# Patient Record
Sex: Female | Born: 1955 | Race: Black or African American | Hispanic: No | Marital: Single | Smoking: Never smoker
Health system: Southern US, Community
[De-identification: ages and names within clinical notes are randomized; demographics above are authoritative.]

## PROBLEM LIST (undated history)

## (undated) DIAGNOSIS — E079 Disorder of thyroid, unspecified: Secondary | ICD-10-CM

## (undated) HISTORY — PX: REPLACEMENT TOTAL KNEE: SUR1224

---

## 2016-08-18 ENCOUNTER — Emergency Department (HOSPITAL_COMMUNITY): Payer: Worker's Compensation

## 2016-08-18 ENCOUNTER — Encounter (HOSPITAL_COMMUNITY): Payer: Self-pay

## 2016-08-18 ENCOUNTER — Emergency Department (HOSPITAL_COMMUNITY)
Admission: EM | Admit: 2016-08-18 | Discharge: 2016-08-18 | Disposition: A | Discharge status: Home / Self Care | Payer: Worker's Compensation | Attending: Emergency Medicine | Admitting: Emergency Medicine

## 2016-08-18 DIAGNOSIS — S60222A Contusion of left hand, initial encounter: Secondary | ICD-10-CM | POA: Diagnosis not present

## 2016-08-18 DIAGNOSIS — S6992XA Unspecified injury of left wrist, hand and finger(s), initial encounter: Secondary | ICD-10-CM | POA: Diagnosis present

## 2016-08-18 DIAGNOSIS — Y939 Activity, unspecified: Secondary | ICD-10-CM | POA: Insufficient documentation

## 2016-08-18 DIAGNOSIS — Y929 Unspecified place or not applicable: Secondary | ICD-10-CM | POA: Insufficient documentation

## 2016-08-18 DIAGNOSIS — W228XXA Striking against or struck by other objects, initial encounter: Secondary | ICD-10-CM | POA: Diagnosis not present

## 2016-08-18 DIAGNOSIS — Y990 Civilian activity done for income or pay: Secondary | ICD-10-CM | POA: Diagnosis not present

## 2016-08-18 HISTORY — DX: Disorder of thyroid, unspecified: E07.9

## 2016-08-18 MED ORDER — DICLOFENAC SODIUM 50 MG PO TBEC: 50.0000 mg | DELAYED_RELEASE_TABLET | Freq: Two times a day (BID) | ORAL | 0 refills | Status: AC

## 2016-08-18 MED ORDER — HYDROCODONE-ACETAMINOPHEN 5-325 MG PO TABS
1.0000 | ORAL_TABLET | Freq: Once | ORAL | Status: AC
  Administered 2016-08-18: 1 via ORAL
  Filled 2016-08-18: qty 1

## 2016-08-18 NOTE — ED Notes (Signed)
Pt taken to xray 

## 2016-08-18 NOTE — Progress Notes (Signed)
Orthopedic Tech Progress Note Patient Details:  Monica Shelton June 08, 1955 [id number]  Ortho Devices Type of Ortho Device: Ace wrap, Volar splint Ortho Device/Splint Location: LUE Ortho Device/Splint Interventions: Ordered, Application   Jennye MoccasinHughes, Albertina Leise Craig 08/18/2016, 9:54 PM

## 2016-08-18 NOTE — ED Triage Notes (Signed)
Pt states she had left hand jammed by heavy boxes at work a couple hours prior to arrival; pt states pain at 6/10 and had not taken med prior to arrival; pt a&ox 4; xray ordered at triage

## 2016-08-18 NOTE — ED Provider Notes (Signed)
[facility]-EMERGENCY DEPT Provider Note   CSN: [id number] Arrival date & time: 08/18/16  2002  By signing my name [id number], I, Modena JanskyAlbert Thayil, attest that this documentation has been prepared under the direction and in the presence of non-physician practitioner, Kerrie BuffaloHope Luevenia Mcavoy, NP. Electronically Signed: Modena JanskyAlbert Thayil, Scribe. 08/18/2016. 9:27 PM.  History   Chief Complaint Chief Complaint  Patient presents with  . Hand Injury   The history is provided by the patient. No language interpreter was used.  Hand Injury   The incident occurred 3 to 5 hours ago. The incident occurred at work. The injury mechanism was compression. The pain is present in the left hand. The pain is moderate. The pain has been constant since the incident. Pertinent negatives include no fever. The symptoms are aggravated by movement and use. She has tried nothing for the symptoms.   HPI Comments: Monica Shelton is a 61 y.o. female who presents to the Emergency Department complaining of constant moderate left hand pain that started 4 hours ago. She states she hit the back of her hand on a heavy box while at work. Her hand was flexed during impact but not beyond her usual ROM. She iced area PTA but took no medication. Her pain is exacerbated by left hand use. He reports associated tingling. Denies any other complaints at this time.  Past Medical History:  Diagnosis Date  . Thyroid disease     There are no active problems to display for this patient.   Past Surgical History:  Procedure Laterality Date  . REPLACEMENT TOTAL KNEE      OB History    No data available       Home Medications    Prior to Admission medications   Medication Sig Start Date End Date Taking? Authorizing Provider  diclofenac (VOLTAREN) 50 MG EC tablet Take 1 tablet (50 mg total) by mouth 2 (two) times daily. 08/18/16   Janne NapoleonNeese, Lerin Jech M, NP    Family History No family history on file.  Social History Social History  Substance Use Topics  . Smoking  status: Never Smoker  . Smokeless tobacco: Not on file  . Alcohol use No     Allergies   Patient has no allergy information on record.   Review of Systems Review of Systems  Constitutional: Negative for fever.  Gastrointestinal: Negative for nausea.  Musculoskeletal: Positive for arthralgias (Left hand) and myalgias (Left hand).  Skin: Negative for wound.  Neurological: Negative for weakness.     Physical Exam Updated Vital Signs BP (!) 157/100 (BP Location: Right Arm)   Pulse 66   Temp 98.6 F (37 C) (Oral)   Resp 20   SpO2 99%   Physical Exam  Constitutional: She appears well-developed and well-nourished. No distress.  HENT:  Head: Normocephalic.  Eyes: Conjunctivae are normal.  Neck: Neck supple.  Cardiovascular: Normal rate and regular rhythm.   Pulses:      Radial pulses are 2+ on the right side, and 2+ on the left side.  Pulmonary/Chest: Effort normal.  Abdominal: Soft.  Musculoskeletal:       Left hand: She exhibits tenderness and swelling. She exhibits normal capillary refill, no deformity and no laceration. Normal sensation noted. Normal strength noted. She exhibits no thumb/finger opposition.  Swelling and tenderness to the dorsum of left hand. Swelling noted to ring finger. Tenderness with palpation and ROM. Adequate circulation.   Neurological: She is alert.  Skin: Skin is warm and dry.  Psychiatric: She has a normal  mood and affect.  Nursing note and vitals reviewed.    ED Treatments / Results  DIAGNOSTIC STUDIES: Oxygen Saturation is 99% on RA, normal by my interpretation.    COORDINATION OF CARE: 9:31 PM- Pt advised of plan for treatment and pt agrees.  Labs (all labs ordered are listed, but only abnormal results are displayed) Labs Reviewed - No data to display  Radiology No results found.  Procedures Procedures (including critical care time)  Medications Ordered in ED Medications  HYDROcodone-acetaminophen (NORCO/VICODIN) 5-325 MG  per tablet 1 tablet (1 tablet Oral Given 08/18/16 2141)     Initial Impression / Assessment and Plan / ED Course  I have reviewed the triage vital signs and the nursing notes.  Pertinent imaging results that were available during my care of the patient were reviewed by me and considered in my medical decision making (see chart for details).  Patient X-Ray negative for obvious fracture or dislocation. Pain managed in ED. Pt advised to follow up with orthopedics if symptoms persist for possibility of missed fracture diagnosis. Patient given brace while in ED, conservative therapy recommended and discussed. Patient will be dc home & is agreeable with above plan.   Final Clinical Impressions(s) / ED Diagnoses   Final diagnoses:  Contusion of left hand, initial encounter    New Prescriptions Discharge Medication List as of 08/18/2016  9:34 PM    START taking these medications   Details  diclofenac (VOLTAREN) 50 MG EC tablet Take 1 tablet (50 mg total) by mouth 2 (two) times daily., Starting Thu 08/18/2016, Print       I personally performed the services described in this documentation, which was scribed in my presence. The recorded information has been reviewed and is accurate.    Kerrie Buffalo [place], [place] 08/20/16 2101    Tilden Fossa, MD 08/31/16 [phone]

## 2018-10-12 ENCOUNTER — Ambulatory Visit: Payer: Self-pay

## 2018-10-12 ENCOUNTER — Other Ambulatory Visit: Payer: Self-pay | Admitting: Family Medicine

## 2018-10-12 ENCOUNTER — Other Ambulatory Visit: Payer: Self-pay

## 2018-10-12 DIAGNOSIS — M542 Cervicalgia: Secondary | ICD-10-CM

## 2018-10-12 DIAGNOSIS — M25511 Pain in right shoulder: Secondary | ICD-10-CM

## 2018-10-12 DIAGNOSIS — M79644 Pain in right finger(s): Secondary | ICD-10-CM

## 2018-10-12 DIAGNOSIS — M79642 Pain in left hand: Secondary | ICD-10-CM

## 2018-10-12 DIAGNOSIS — M25512 Pain in left shoulder: Secondary | ICD-10-CM

## 2020-10-10 IMAGING — DX CERVICAL SPINE - COMPLETE 4+ VIEW
5 series · 5 of 5 positions shown · non-contrast
Comparison: 03/29/2006.

CLINICAL DATA: Injury.

EXAM:
CERVICAL SPINE - COMPLETE 4+ VIEW

[c-spine lat]
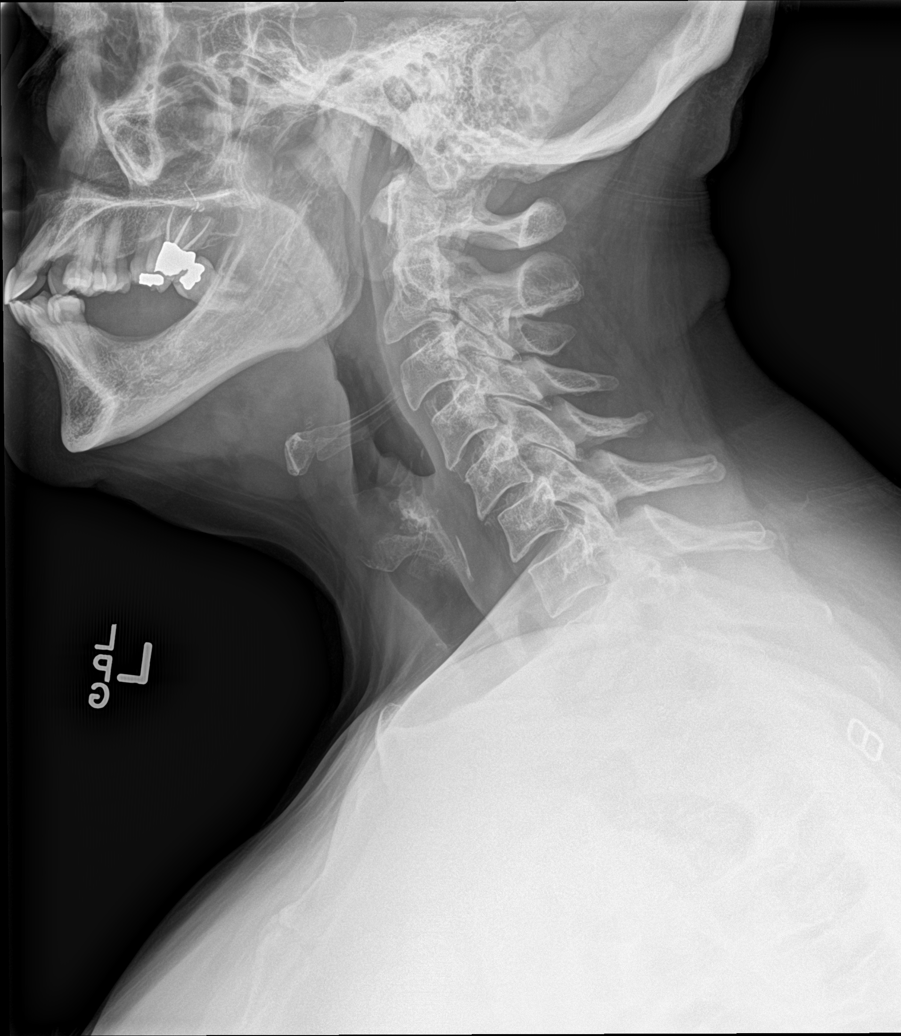

[c-spine obl (1 of 2)]
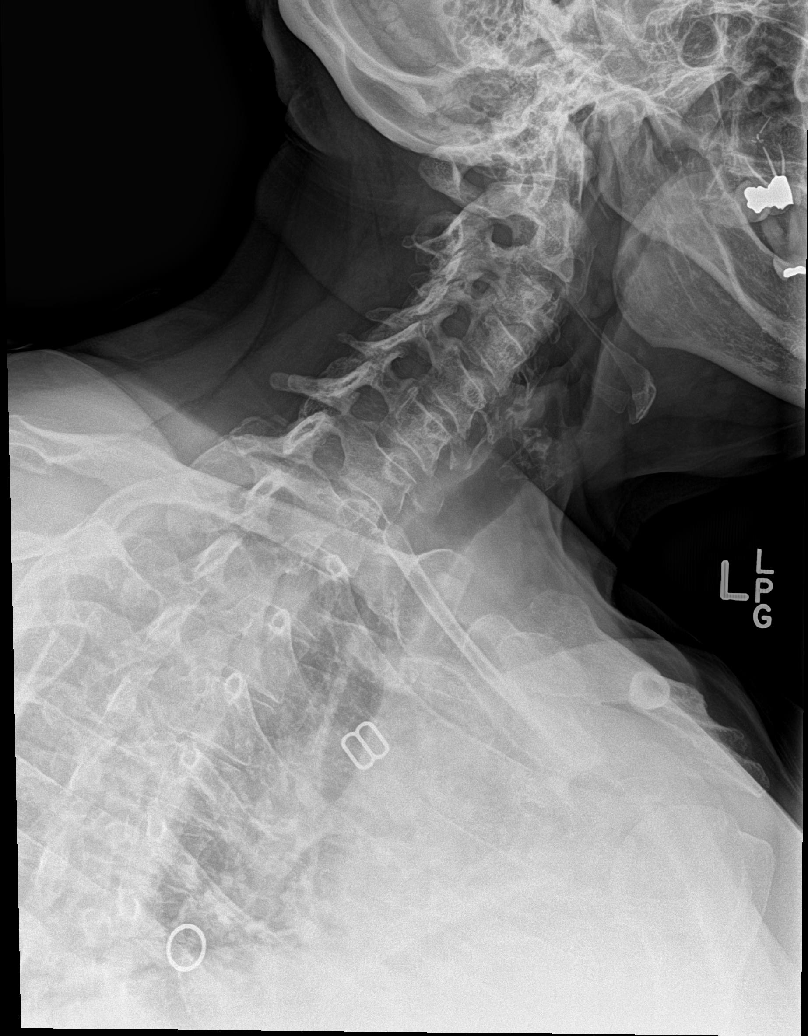

[c-spine obl (2 of 2)]
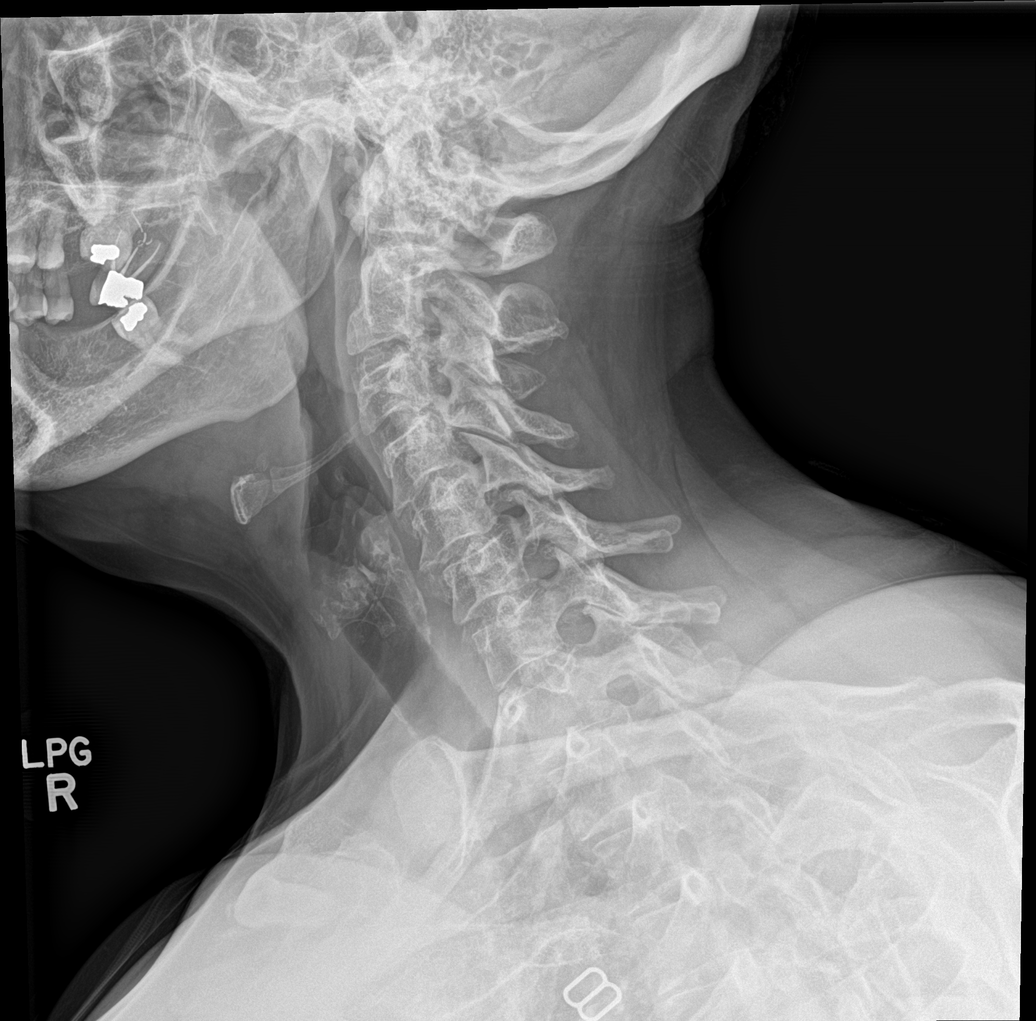

[c-spine ap]
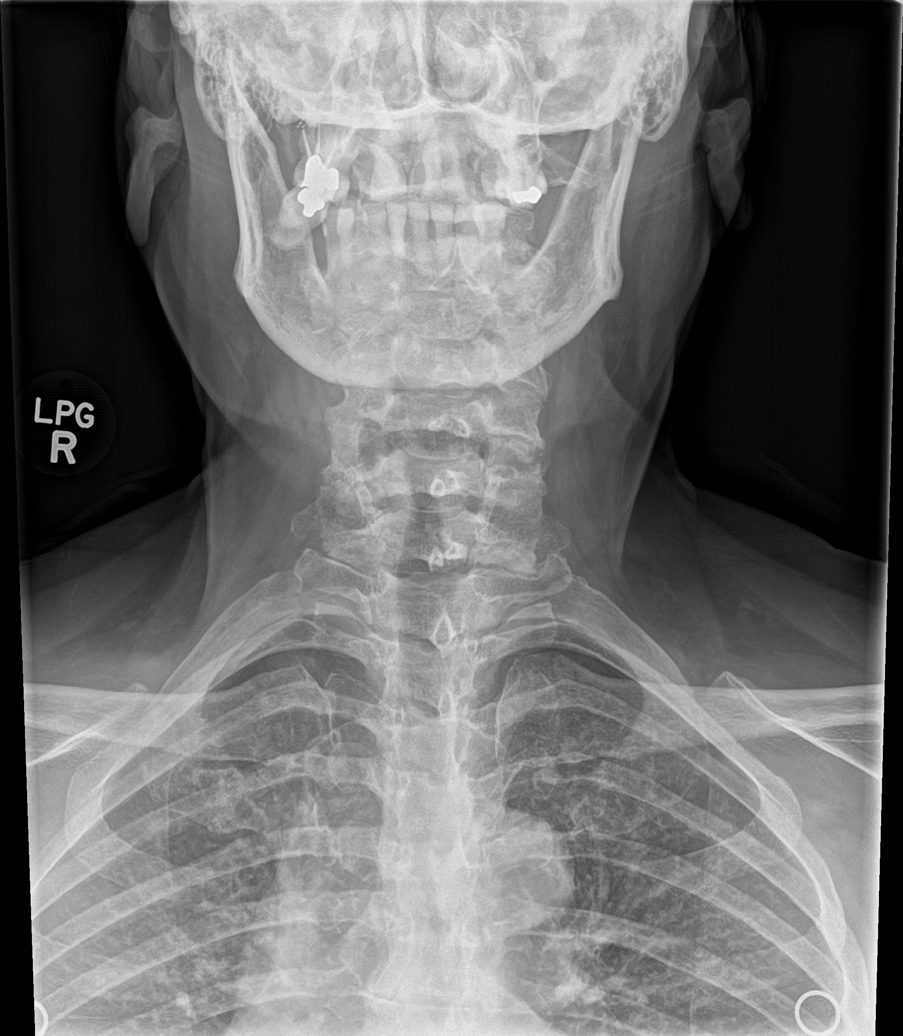

[c-spine open mouth]
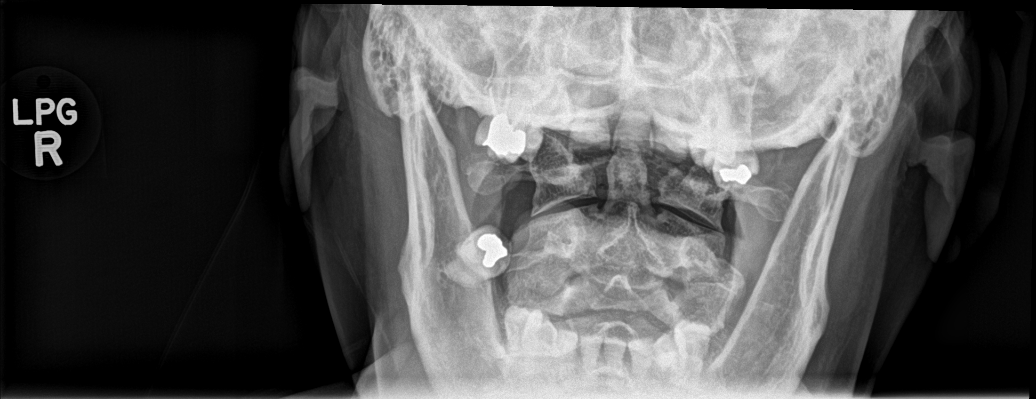

[5 of 5 positions shown; findings below may reference images not displayed]

FINDINGS: Diffuse degenerative change. Loss of normal cervical lordosis. 4 mm
anterolisthesis C5 on C6. 3 mm anterolisthesis C6 on C7. No evidence
of fracture dislocation. Pulmonary apices are clear.
IMPRESSION: Diffuse degenerative change. Loss of normal cervical lordosis. 4 mm
anterolisthesis C5 on C6. 3 mm anterolisthesis C6 on C7. No evidence
of fracture or dislocation.
